# Patient Record
Sex: Female | Born: 1995 | Race: White | Hispanic: No | Marital: Single | Smoking: Never smoker
Health system: Southern US, Community
[De-identification: ages and names within clinical notes are randomized; demographics above are authoritative.]

## PROBLEM LIST (undated history)

## (undated) DIAGNOSIS — N924 Excessive bleeding in the premenopausal period: Secondary | ICD-10-CM

## (undated) HISTORY — PX: HAND SURGERY: SHX662

## (undated) HISTORY — DX: Excessive bleeding in the premenopausal period: N92.4

---
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
## 2014-03-10 ENCOUNTER — Ambulatory Visit (INDEPENDENT_AMBULATORY_CARE_PROVIDER_SITE_OTHER): Payer: BC Managed Care – PPO | Admitting: Family Medicine

## 2014-03-10 ENCOUNTER — Encounter: Payer: Self-pay | Admitting: Family Medicine

## 2014-03-10 VITALS — BP 120/72 | HR 98 | Temp 98.7°F | Ht 64.0 in | Wt 127.0 lb

## 2014-03-10 DIAGNOSIS — J189 Pneumonia, unspecified organism: Secondary | ICD-10-CM | POA: Insufficient documentation

## 2014-03-10 DIAGNOSIS — J181 Lobar pneumonia, unspecified organism: Principal | ICD-10-CM

## 2014-03-10 MED ORDER — HYDROCODONE-HOMATROPINE 5-1.5 MG/5ML PO SYRP: 5.0000 mL | ORAL_SOLUTION | Freq: Three times a day (TID) | ORAL | Status: DC | PRN

## 2014-03-10 MED ORDER — CLARITHROMYCIN 250 MG PO TABS: 250.0000 mg | ORAL_TABLET | Freq: Two times a day (BID) | ORAL | Status: DC

## 2014-03-10 NOTE — Progress Notes (Signed)
Pre visit review using our clinic review tool, if applicable. No additional management support is needed unless otherwise documented below in the visit note. 

## 2014-03-10 NOTE — Progress Notes (Signed)
   Subjective:    Patient ID: Vickie Thomas, female    DOB: 10/05/1996, 18 y.o.   MRN: [id number]  HPI Vickie Thomas is a 18 year old single female nonsmoker,,,,,,,, [place],,,,,, who comes in today with a six-day history of fever chills headache sore throat and cough  The fever and chills last about 3 or 4 days and went away. She otherwise feels well now said she's got a slight headache and a cough. No sputum production no shortness of breath etc. etc.  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   Review of Systems Review of systems otherwise negative    Objective:   Physical Exam  Well-developed well-nourished female no acute distress vital signs stable she is afebrile HEENT negative neck was supple no adenopathy lung exam shows crackles right base      Assessment & Plan:  Right lower lobe pneumonia...Marland Kitchen.Marland Kitchen. probable mycoplasmal..... treat with erythromycin cough syrup Tylenol.

## 2014-03-10 NOTE — Patient Instructions (Signed)
Biaxin 250 mg....... one twice daily for 10 days  Hydromet.......Marland Kitchen. 1/2-1 teaspoon at bedtime when necessary for cough and cold  Return when necessary

## 2014-03-23 ENCOUNTER — Other Ambulatory Visit: Payer: Self-pay

## 2014-03-23 ENCOUNTER — Encounter: Payer: Self-pay | Admitting: Family Medicine

## 2014-03-23 ENCOUNTER — Ambulatory Visit (INDEPENDENT_AMBULATORY_CARE_PROVIDER_SITE_OTHER)
Admission: RE | Admit: 2014-03-23 | Discharge: 2014-03-23 | Disposition: A | Discharge status: Home / Self Care | Payer: BC Managed Care – PPO | Source: Ambulatory Visit | Attending: Family Medicine | Admitting: Family Medicine

## 2014-03-23 ENCOUNTER — Ambulatory Visit (INDEPENDENT_AMBULATORY_CARE_PROVIDER_SITE_OTHER): Payer: BC Managed Care – PPO | Admitting: Family Medicine

## 2014-03-23 VITALS — BP 120/68 | HR 98 | Temp 98.6°F | Wt 127.0 lb

## 2014-03-23 DIAGNOSIS — R059 Cough, unspecified: Secondary | ICD-10-CM

## 2014-03-23 DIAGNOSIS — R05 Cough: Secondary | ICD-10-CM

## 2014-03-23 MED ORDER — BENZONATATE 200 MG PO CAPS: 200.0000 mg | ORAL_CAPSULE | Freq: Three times a day (TID) | ORAL | Status: DC | PRN

## 2014-03-23 NOTE — Progress Notes (Signed)
Pre visit review using our clinic review tool, if applicable. No additional management support is needed unless otherwise documented below in the visit note. 

## 2014-03-23 NOTE — Progress Notes (Signed)
Subjective:    Patient ID: Vickie Thomas, female    DOB: 04/06/1996, 18 y.o.   MRN: [id number]  HPI Comments: Patient is an 18 year old female who had an appointment tomorrow but left school and scheduled an appointment today because of an intense coughing spell at school around 9am this morning. This episode lasted several minutes, was non-productive and no hemoptysis. Cough was described as so intense that it caused gagging reflex but no vomiting. She was recently treated for pneumonia and completed her course of clarithromycin and hycodan. Symptoms were somewhat relieved [place] [employer] [place] break a week ago. Now she has returned to school, is sedentary and symptoms are worsening. There is a cold going around the school but she has no specific sick contacts. Mom listened to her lungs with stethoscope at home and thought she heard something in the right lung. Associated symptoms include one episode of dull throbbing right sided headache right lasting 2-3 hours and relieved by advil, some nasal congestion without dental pain relieved with [place] saline spray, post nasal drip and rhinorrhea. Patient endorses fatigue, chest tightness and some shortness of breath. No PMH of asthma or smoking. Patient denies fever, dizziness, enlarged nodes, body aches, joint pain, nausea, vomiting, unilateral leg swelling, leg pain or calf cramping. Patient did not have the flu shot this year.  Cough The current episode started 1 to 4 weeks ago. The problem has been gradually worsening. The problem occurs every few hours. The cough is non-productive. Associated symptoms include ear congestion, headaches, nasal congestion, postnasal drip, rhinorrhea and shortness of breath. Pertinent negatives include no chills, fever, hemoptysis, myalgias or weight loss.      Review of Systems  Constitutional: Negative for fever, chills and weight loss.  HENT: Positive for postnasal drip and rhinorrhea. Negative for  tinnitus and trouble swallowing.   Respiratory: Positive for cough and shortness of breath. Negative for hemoptysis.   Gastrointestinal: Negative for nausea, vomiting, abdominal pain, diarrhea and constipation.  Musculoskeletal: Negative for back pain, gait problem and myalgias.  Neurological: Positive for headaches. Negative for syncope and light-headedness.       Objective:   Physical Exam  Constitutional: Vital signs are normal. She appears well-developed and well-nourished.  HENT:  Head: Normocephalic.  Right Ear: External ear normal. No tenderness.  Left Ear: External ear normal. No tenderness.  Nose: Nose normal. No rhinorrhea. Right sinus exhibits no maxillary sinus tenderness and no frontal sinus tenderness. Left sinus exhibits no maxillary sinus tenderness and no frontal sinus tenderness.  Mouth/Throat: Oropharynx is clear and moist and mucous membranes are normal. Mucous membranes are not pale and not dry. No oropharyngeal exudate.  Eyes: Conjunctivae and EOM are normal. Pupils are equal, round, and reactive to light.  Neck: Normal range of motion.  Cardiovascular: Normal heart sounds.   Pulmonary/Chest: Effort normal and breath sounds normal. No respiratory distress. She has no wheezes. She has no rales. She exhibits no tenderness.  Lymphadenopathy:       Head (right side): No submental, no submandibular, no tonsillar, no preauricular, no posterior auricular and no occipital adenopathy present.       Head (left side): No submental, no submandibular, no tonsillar, no preauricular, no posterior auricular and no occipital adenopathy present.    She has no cervical adenopathy.  Neurological: She is alert. She has normal strength. Coordination and gait normal.  Skin: Skin is warm. She is diaphoretic.  Assessment & Plan:  1. Cough Since patient is not improving after taking clarithromycin, plan to image with chest xray to determine if infection remains. Will not do  blood count. Will consider prescribing stronger antibiotic based on xray results. Patient given educational handout and advised to seek medical treatment if she develops fever or if symptoms worsen.  GrenadaBrittany Zacharey Jensen, PA-S   As above,  Presumptive dx of pneumonia 2 weeks ago.  No documented recurrent fever but has persistent cough with nonfocal exam.  CXR to further evaluate.  She is in no respiratory distress and afebrile today.  Evelena PeatBruce Burchette, MD

## 2014-03-23 NOTE — Patient Instructions (Signed)

## 2014-03-24 ENCOUNTER — Ambulatory Visit: Payer: BC Managed Care – PPO | Admitting: Family Medicine

## 2014-07-16 ENCOUNTER — Ambulatory Visit: Payer: Self-pay | Admitting: Nurse Practitioner

## 2014-07-16 ENCOUNTER — Telehealth: Payer: Self-pay | Admitting: Family Medicine

## 2014-07-16 ENCOUNTER — Encounter: Payer: Self-pay | Admitting: Family Medicine

## 2014-07-16 ENCOUNTER — Ambulatory Visit (INDEPENDENT_AMBULATORY_CARE_PROVIDER_SITE_OTHER): Payer: BC Managed Care – PPO | Admitting: Family Medicine

## 2014-07-16 VITALS — BP 100/60 | HR 140 | Temp 103.0°F | Ht 64.0 in | Wt 128.0 lb

## 2014-07-16 DIAGNOSIS — J020 Streptococcal pharyngitis: Secondary | ICD-10-CM

## 2014-07-16 DIAGNOSIS — J029 Acute pharyngitis, unspecified: Secondary | ICD-10-CM

## 2014-07-16 LAB — POCT RAPID STREP A (OFFICE): Rapid Strep A Screen: POSITIVE — AB

## 2014-07-16 MED ORDER — PENICILLIN G BENZATHINE 1200000 UNIT/2ML IM SUSP
1.2000 10*6.[IU] | Freq: Once | INTRAMUSCULAR | Status: AC
  Administered 2014-07-16: 1.2 10*6.[IU] via INTRAMUSCULAR

## 2014-07-16 NOTE — Telephone Encounter (Signed)
Patient Information:  Caller Name: Vickie Thomas  Phone: [phone]  Patient: Vickie Thomas, Vickie Thomas  Gender: Female  DOB: 1996/11/07  Age: 18 Years  PCP: Evelena PeatBurchette, [facility](Family Practice)  Pregnant: No  Office Follow Up:  Does the office need to follow up with this patient?: No  Instructions For The Office: N/A  RN Note:  Sore Throat, Lymph Node swelling, Fever, onset 8-12.  Pt is drinking and urinating normally. All emergent sxs ruled out per Sore Throat protocol, see today d/t severe sore throat pain.  [facility] at [facility] d/t [facility] Set[facility]assfied was full, office called Dad during triage w/ Mom and scheduled appt at 1430 on 8-13 w/ Dr Durene CalHunter.  Symptoms  Reason For Call & Symptoms: Sore Throat, Lymph Node swelling, Fever, onset 8-12.  Reviewed Health History In EMR: Yes  Reviewed Medications In EMR: Yes  Reviewed Allergies In EMR: Yes  Reviewed Surgeries / Procedures: Yes  Date of Onset of Symptoms: 07/15/2014  Any Fever: Yes  Fever Taken: Ear Thermometer  Fever Time Of Reading: 07:30:00  Fever Last Reading: 101.9 OB / GYN:  LMP: Unknown  Guideline(s) Used:  Sore Throat  Disposition Per Guideline:   See Today in Office  Reason For Disposition Reached:   Severe sore throat pain  Advice Given:  N/A  Patient Will Follow Care Advice:  YES

## 2014-07-16 NOTE — Patient Instructions (Signed)
If still having fevers Monday, please return or if symptoms worsen we have Saturday hours.  I suspect you will feel better in 24-48 hours. Not infectious after 24 hours without a fever above 100.5.  Tylenol/ibuprofen for pain-would schedule over next day.   Strep Throat Strep throat is an infection of the throat caused by a bacteria named Streptococcus pyogenes. Your health care provider may call the infection streptococcal "tonsillitis" or "pharyngitis" depending on whether there are signs of inflammation in the tonsils or back of the throat. Strep throat is most common in children aged 5-15 years during the cold months of the year, but it can occur in people of any age during any season. This infection is spread from person to person (contagious) through coughing, sneezing, or other close contact. SIGNS AND SYMPTOMS   Fever or chills.  Painful, swollen, red tonsils or throat.  Pain or difficulty when swallowing.  White or yellow spots on the tonsils or throat.  Swollen, tender lymph nodes or "glands" of the neck or under the jaw.  Red rash all over the body (rare). DIAGNOSIS  Many different infections can cause the same symptoms. A test must be done to confirm the diagnosis so the right treatment can be given. A "rapid strep test" can help your health care provider make the diagnosis in a few minutes. If this test is not available, a light swab of the infected area can be used for a throat culture test. If a throat culture test is done, results are usually available in a day or two. TREATMENT  Strep throat is treated with antibiotic medicine. HOME CARE INSTRUCTIONS   Gargle with 1 tsp of salt in 1 cup of warm water, 3-4 times per day or as needed for comfort.  Family members who also have a sore throat or fever should be tested for strep throat and treated with antibiotics if they have the strep infection.  Make sure everyone in your household washes their hands well.  Do not share  food, drinking cups, or personal items that could cause the infection to spread to others.  You may need to eat a soft food diet until your sore throat gets better.  Drink enough water and fluids to keep your urine clear or pale yellow. This will help prevent dehydration.  Get plenty of rest.  Stay home from school, day care, or work until you have been on antibiotics for 24 hours.  Take medicines only as directed by your health care provider.  Take your antibiotic medicine as directed by your health care provider. Finish it even if you start to feel better. SEEK MEDICAL CARE IF:   The glands in your neck continue to enlarge.  You develop a rash, cough, or earache.  You cough up green, yellow-brown, or bloody sputum.  You have pain or discomfort not controlled by medicines.  Your problems seem to be getting worse rather than better.  You have a fever. SEEK IMMEDIATE MEDICAL CARE IF:   You develop any new symptoms such as vomiting, severe headache, stiff or painful neck, chest pain, shortness of breath, or trouble swallowing.  You develop severe throat pain, drooling, or changes in your voice.  You develop swelling of the neck, or the skin on the neck becomes red and tender.  You develop signs of dehydration, such as fatigue, dry mouth, and decreased urination.  You become increasingly sleepy, or you cannot wake up completely. MAKE SURE YOU:  Understand these instructions.  Will  watch your condition.  Will get help right away if you are not doing well or get worse. Document Released: 11/17/2000 Document Revised: 04/06/2014 Document Reviewed: 01/19/2011 [facility] Patient Information 2015 [place], [place]. This information is not intended to replace advice given to you by your health care provider. Make sure you discuss any questions you have with your health care provider.

## 2014-07-16 NOTE — Telephone Encounter (Signed)
Pt here for appt.

## 2014-07-16 NOTE — Addendum Note (Signed)
Addended by: Lieutenant DiegoHINES, Remie Mathison A on: 07/16/2014 05:14 PM   Modules accepted: Orders

## 2014-07-16 NOTE — Progress Notes (Signed)
  Vickie ConchStephen Hunter, MD Phone: [phone]  Subjective:   Vickie Thomas is a 18 y.o. year old very pleasant female patient who presents with the following:  Fever/sore throat Symptoms started yesterday at 6pm including fever to 101 at home.Sore throat started last night. No known sick contacts. Around roommate and family in last week as well as a party last weekend. Endorses chills.  ROS-No cough. No congestion. No nausea/vomiting.   Past Medical History-history of walking pneumonia in April, otherwise healthy Social history-was about to [place] [facility] today [place] [employer] Medications- birth control by ob/gyn  Objective: BP 100/60  Pulse 140  Temp(Src) 103 F (39.4 C)  Ht 5\' 4"  (1.626 m)  Wt 128 lb (58.06 kg)  BMI 21.96 kg/m2  SpO2 97% Gen: NAD, resting comfortably Neck: tender lymphadenopathy in anterior chain HEENT: TM normal, mildly dry mucus membranes, pharynx cherry red with edematous tonsils 2+ CV: tachycardia no murmurs rubs or gallops Lungs: CTAB no crackles, wheeze, rhonchi Abdomen: soft/nontender/nondistended/normal bowel sounds.  Ext: no edema Skin: warm, dry, no rash   Results for orders placed in visit on 07/16/14 (from the past 24 hour(s))  POCT RAPID STREP A (OFFICE)     Status: Abnormal   Collection Time    07/16/14  2:56 PM      Result Value Ref Range   Rapid Strep A Screen Positive (*) Negative   Assessment/Plan:  Streptococcal Strep Throat Bicillin IM. Gave precautions for return.   No orders of the defined types were placed in this encounter.

## 2014-07-16 NOTE — Telephone Encounter (Signed)
Keba-FYI.

## 2014-07-17 ENCOUNTER — Telehealth: Payer: Self-pay | Admitting: Family Medicine

## 2014-07-17 NOTE — Telephone Encounter (Signed)
FYI

## 2014-07-17 NOTE — Telephone Encounter (Signed)
Patient Information:  Caller Name: Fransisco HertzSiobhan  Phone: [phone]  Patient: Vickie Thomas, [name]  Gender: Female  DOB: September 22, 1996  Age: [age] Years  PCP: Evelena PeatBurchette, [facility](Family Practice)  Pregnant: No  Office Follow Up:  Does the office need to follow up with this patient?: No  Instructions For The Office: N/A  RN Note:  Advised mom it takes 48 hours for antibiotic to get into the system.  Advised increase fluids, cold fluids work with strep throat, tylenol and ibuprofen as prescribed. Call back if pt is not improved after 48 hrs from injection.  Symptoms  Reason For Call & Symptoms: Mom reports pt was seen in the office 07/16/14 and diagnosed with Strep throat. PCN injection given.  Mom states pt still have fever and sore throat pain.  Reviewed Health History In EMR: Yes  Reviewed Medications In EMR: Yes  Reviewed Allergies In EMR: Yes  Reviewed Surgeries / Procedures: Yes  Date of Onset of Symptoms: 07/16/2014  Treatments Tried: Tylenol and Ibuprofen alternating.  Treatments Tried Worked: Yes  Any Fever: Yes  Fever Taken: Ear Thermometer  Fever Time Of Reading: 08:30:00  Fever Last Reading: 102.2 OB / GYN:  LMP: Unknown  Guideline(s) Used:  Strep Throat Test Follow-Up Call  Sore Throat  No Protocol Available - Sick Adult  Disposition Per Guideline:   Home Care  Reason For Disposition Reached:   Patient's symptoms are safe to treat at home per nursing judgment  Advice Given:  Call Back If:  New symptoms develop  You become worse.  Patient Will Follow Care Advice:  YES

## 2014-07-18 ENCOUNTER — Ambulatory Visit (INDEPENDENT_AMBULATORY_CARE_PROVIDER_SITE_OTHER): Payer: BC Managed Care – PPO | Admitting: Family Medicine

## 2014-07-18 ENCOUNTER — Encounter: Payer: Self-pay | Admitting: Family Medicine

## 2014-07-18 VITALS — BP 108/72 | Temp 98.9°F | Wt 123.0 lb

## 2014-07-18 DIAGNOSIS — J020 Streptococcal pharyngitis: Secondary | ICD-10-CM

## 2014-07-18 LAB — POCT RAPID STREP A (OFFICE): Rapid Strep A Screen: NEGATIVE

## 2014-07-18 NOTE — Progress Notes (Signed)
   Subjective:    Patient ID: Vickie Thomas, female    DOB: [birth year], 18 y.o.   MRN: [id number]  Sore Throat  Pertinent negatives include no coughing.  Fever  Associated symptoms include a sore throat. Pertinent negatives include no coughing.   Patient seen for followup regarding strep pharyngitis. She was seen on Thursday (2 days ago) and received Bicillin penicillin. Her fever has trended downward and she is afebrile at this time. She still has swollen nodes and sore throat but overall feels slightly better. No nausea or vomiting. No headaches. No skin rash. 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  Past Medical History  Diagnosis Date  . Premenopausal menorrhagia 10/08/2013   Past Surgical History  Procedure Laterality Date  . Hand surgery      reports that she has never smoked. She does not have any smokeless tobacco history on file. She reports that she does not drink alcohol or use illicit drugs. family history includes Asthma in her paternal grandmother; Cancer in her maternal grandfather; Depression in her paternal grandmother; Heart disease in her paternal grandfather and paternal grandmother; Hyperlipidemia in her father; Hypertension in her paternal grandfather and paternal grandmother. No Known Allergies      Review of Systems  Constitutional: Positive for fever. Negative for chills.  HENT: Positive for sore throat.   Respiratory: Negative for cough.        Objective:   Physical Exam  Constitutional: She appears well-developed and well-nourished. No distress.  HENT:  Right Ear: External ear normal.  Left Ear: External ear normal.  Mild erythema posterior pharynx. No exudate  Neck:  She has tender anterior cervical nodes bilaterally. Neck is supple  Cardiovascular: Normal rate.   Pulmonary/Chest: Effort normal and breath sounds normal. No respiratory distress. She has no wheezes. She has no rales.  Skin:  No rash noted.          Assessment & Plan:  Strep pharyngitis. Patient received Bicillin penicillin injection couple days ago and is gradually improving. Recommend symptomatic treatment at this point and followup promptly for recurrent fever or worsening symptoms.

## 2014-08-30 ENCOUNTER — Encounter (HOSPITAL_COMMUNITY): Payer: Self-pay | Admitting: Emergency Medicine

## 2014-08-30 ENCOUNTER — Emergency Department (HOSPITAL_COMMUNITY)
Admission: EM | Admit: 2014-08-30 | Discharge: 2014-08-30 | Disposition: A | Discharge status: Home / Self Care | Payer: BC Managed Care – PPO | Attending: Emergency Medicine | Admitting: Emergency Medicine

## 2014-08-30 DIAGNOSIS — S298XXA Other specified injuries of thorax, initial encounter: Secondary | ICD-10-CM | POA: Insufficient documentation

## 2014-08-30 DIAGNOSIS — Y9389 Activity, other specified: Secondary | ICD-10-CM | POA: Diagnosis not present

## 2014-08-30 DIAGNOSIS — R110 Nausea: Secondary | ICD-10-CM | POA: Diagnosis not present

## 2014-08-30 DIAGNOSIS — S0990XA Unspecified injury of head, initial encounter: Secondary | ICD-10-CM | POA: Diagnosis not present

## 2014-08-30 DIAGNOSIS — Z8742 Personal history of other diseases of the female genital tract: Secondary | ICD-10-CM | POA: Diagnosis not present

## 2014-08-30 DIAGNOSIS — Y92410 Unspecified street and highway as the place of occurrence of the external cause: Secondary | ICD-10-CM | POA: Diagnosis not present

## 2014-08-30 MED ORDER — ACETAMINOPHEN 325 MG PO TABS
650.0000 mg | ORAL_TABLET | Freq: Once | ORAL | Status: AC
  Administered 2014-08-30: 650 mg via ORAL
  Filled 2014-08-30: qty 2
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
## 2015-08-22 IMAGING — CR DG CHEST 2V
2 series · 2 of 2 positions shown · non-contrast
Comparison: None.

CLINICAL DATA: Cough.  Shortness of breath.

EXAM:
CHEST  2 VIEW

[view not recorded (1 of 2)]
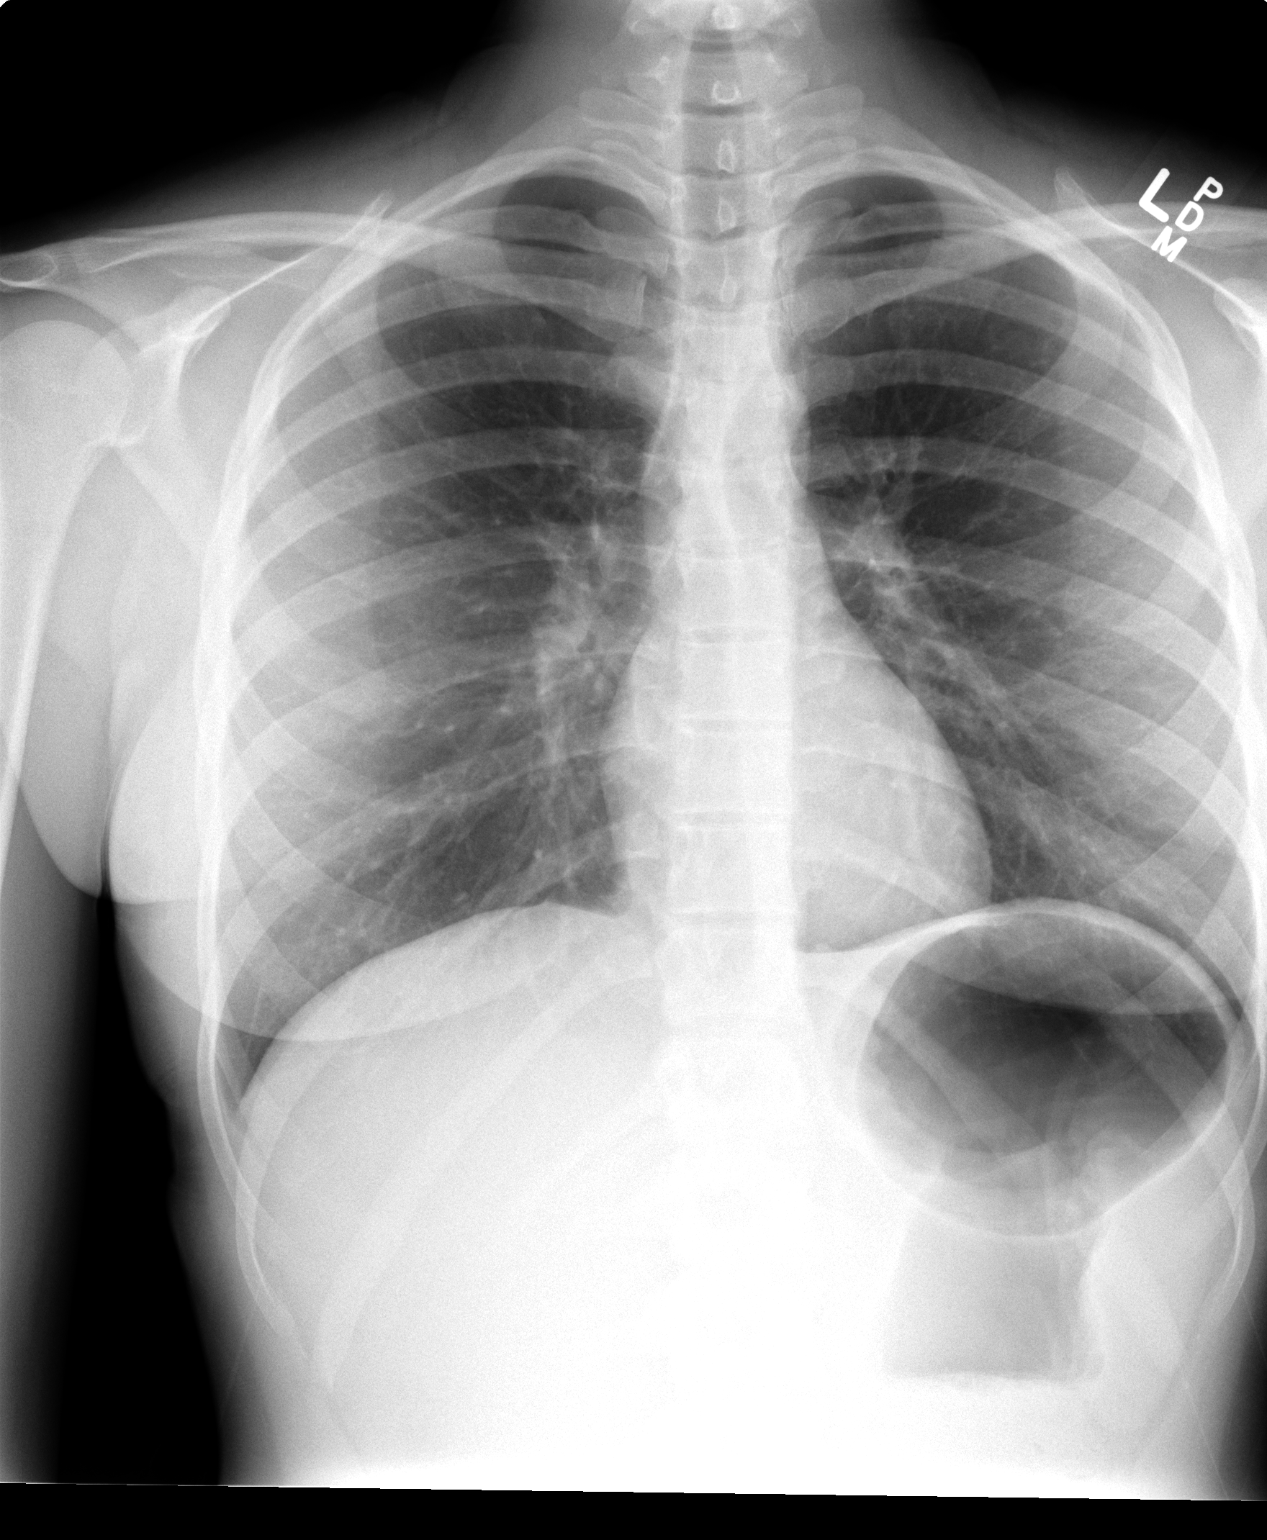

[view not recorded (2 of 2)]
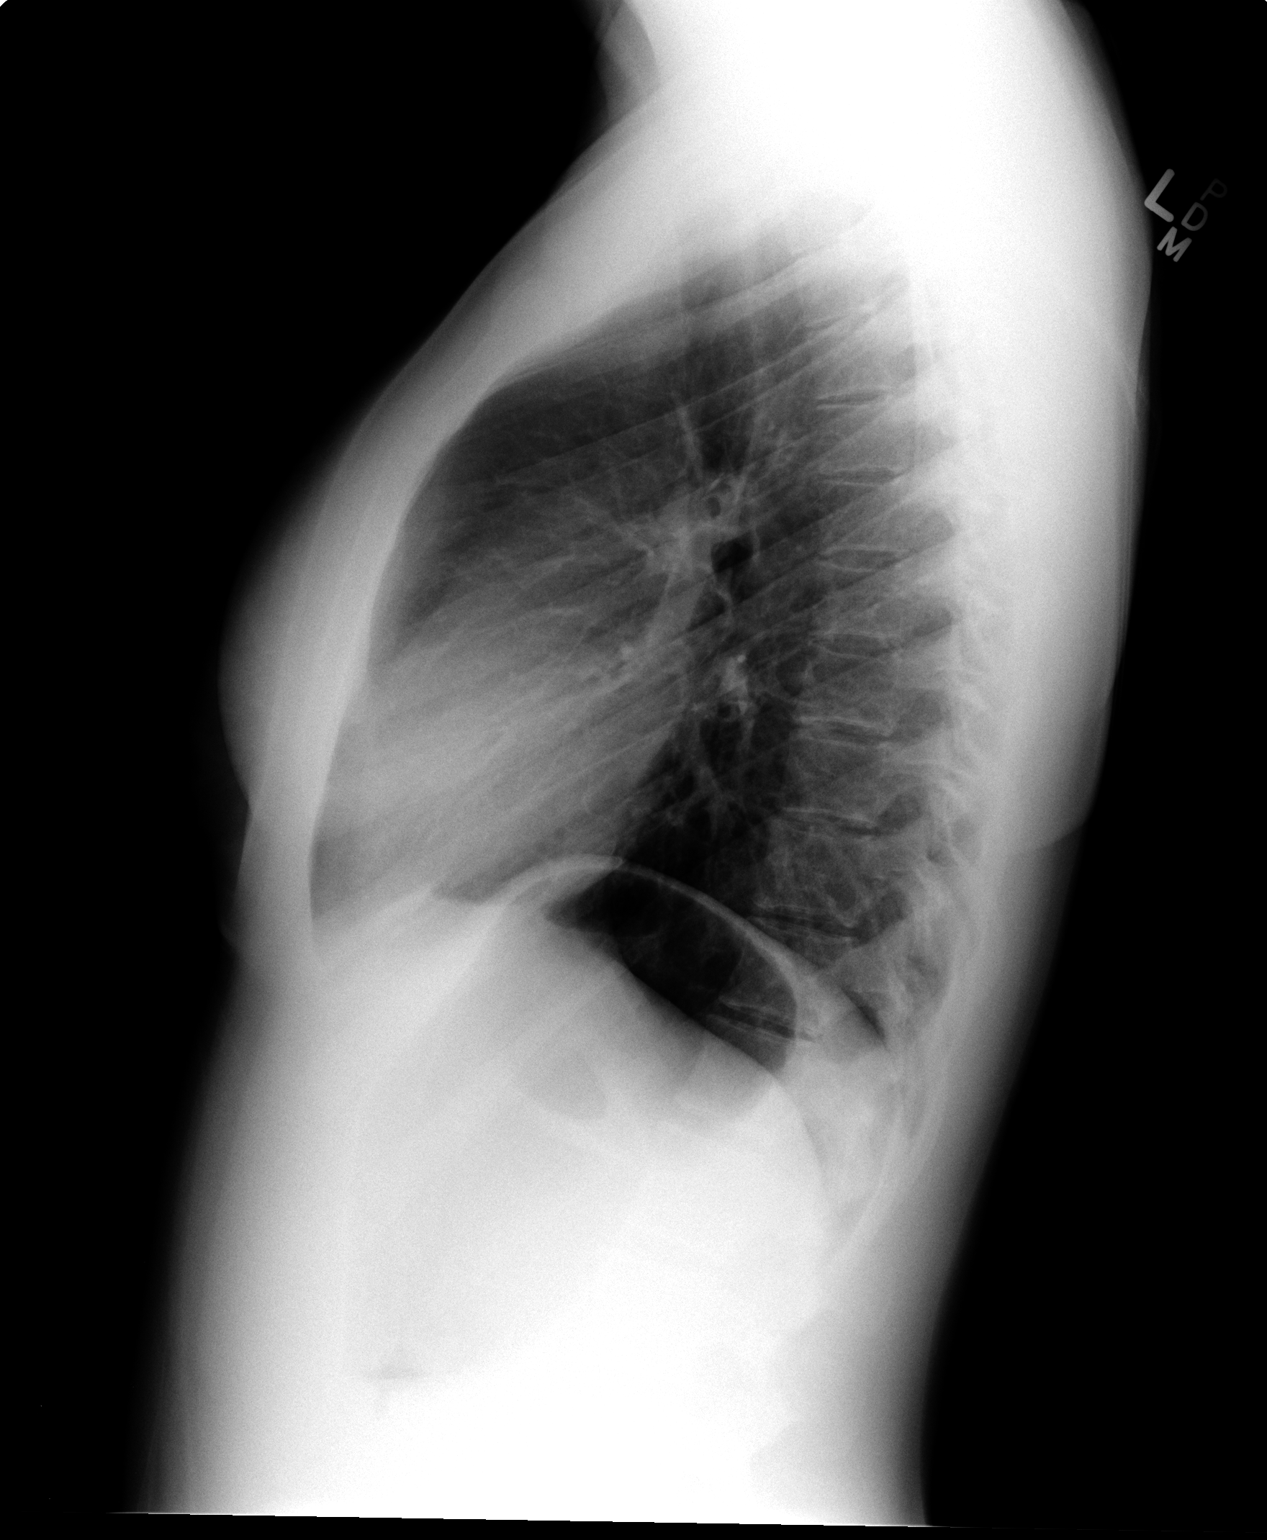

[2 of 2 positions shown; findings below may reference images not displayed]

FINDINGS: The heart size and mediastinal contours are within normal limits.
Both lungs are clear. The visualized skeletal structures are
unremarkable.
IMPRESSION: No active cardiopulmonary disease.

## 2017-03-30 ENCOUNTER — Encounter: Payer: Self-pay | Admitting: Family Medicine

## 2017-03-30 ENCOUNTER — Ambulatory Visit (INDEPENDENT_AMBULATORY_CARE_PROVIDER_SITE_OTHER): Payer: BLUE CROSS/BLUE SHIELD | Admitting: Family Medicine

## 2017-03-30 VITALS — BP 108/84 | HR 120 | Temp 99.1°F | Wt 126.3 lb

## 2017-03-30 DIAGNOSIS — R233 Spontaneous ecchymoses: Secondary | ICD-10-CM

## 2017-03-30 DIAGNOSIS — J029 Acute pharyngitis, unspecified: Secondary | ICD-10-CM | POA: Diagnosis not present

## 2017-03-30 LAB — CBC WITH DIFFERENTIAL/PLATELET
BASOS ABS: 0 10*3/uL (ref 0.0–0.1)
BASOS PCT: 0.2 % (ref 0.0–3.0)
Eosinophils Absolute: 0 10*3/uL (ref 0.0–0.7)
Eosinophils Relative: 0.2 % (ref 0.0–5.0)
HEMATOCRIT: 39.9 % (ref 36.0–46.0)
HEMOGLOBIN: 13.5 g/dL (ref 12.0–15.0)
LYMPHS PCT: 12.8 % (ref 12.0–46.0)
Lymphs Abs: 1.1 10*3/uL (ref 0.7–4.0)
MCHC: 33.8 g/dL (ref 30.0–36.0)
MCV: 85.7 fl (ref 78.0–100.0)
MONOS PCT: 13.1 % — AB (ref 3.0–12.0)
Monocytes Absolute: 1.1 10*3/uL — ABNORMAL HIGH (ref 0.1–1.0)
NEUTROS ABS: 6.2 10*3/uL (ref 1.4–7.7)
Neutrophils Relative %: 73.7 % (ref 43.0–77.0)
PLATELETS: 172 10*3/uL (ref 150.0–400.0)
RBC: 4.66 Mil/uL (ref 3.87–5.11)
RDW: 14.4 % (ref 11.5–15.5)
WBC: 8.4 10*3/uL (ref 4.0–10.5)

## 2017-03-30 LAB — POCT RAPID STREP A (OFFICE): Rapid Strep A Screen: NEGATIVE

## 2017-03-30 LAB — POCT MONO (EPSTEIN BARR VIRUS): Mono, POC: NEGATIVE

## 2017-03-30 NOTE — Progress Notes (Signed)
Subjective:     Patient ID: Vickie Thomas, female   DOB: [birth year], 21 y.o.   MRN: [id number]  HPI Patient seen with sore throat, body aches, night sweats for the past few days. She states this is a third episode in about 2 months. Initial episode occurred around early March. She went to student health at [facility] There was no testing done. They speculated she has some type of viral illness. She got better after a few days. Around and of March she had similar occurrence and [facility] She states that rapid strep testing and strep cultures were both negative.  Current symptoms started couple days ago with sore throat, body aches, night sweats. Denies any nasal congestion. No cough. No allergic symptoms. No rash. She's had some tenderness anterior cervical nodes. She's not noted any posterior cervical nodes. No history of mono. Has had some general fatigue. She's also noted some non-provoked bruises on her lower extremities. No other bleeding issues. Low-grade fever around 100 last night. No aspirin use  Past Medical History:  Diagnosis Date  . Premenopausal menorrhagia 10/08/2013   Past Surgical History:  Procedure Laterality Date  . HAND SURGERY      reports that she has never smoked. She has never used smokeless tobacco. She reports that she does not drink alcohol or use drugs. family history includes Asthma in her paternal grandmother; Cancer in her maternal grandfather; Depression in her paternal grandmother; Heart disease in her paternal grandfather and paternal grandmother; Hyperlipidemia in her father; Hypertension in her paternal grandfather and paternal grandmother. No Known Allergies   Review of Systems  Constitutional: Positive for fatigue and fever.  HENT: Positive for sore throat.   Respiratory: Negative for cough and shortness of breath.   Cardiovascular: Negative for chest pain.  Gastrointestinal: Negative for abdominal pain, nausea and vomiting.   Genitourinary: Negative for dysuria.  Skin: Negative for rash.  Neurological: Negative for headaches.  Hematological: Bruises/bleeds easily.       Objective:   Physical Exam  Constitutional: She appears well-developed and well-nourished.  HENT:  Right Ear: External ear normal.  Left Ear: External ear normal.  Mouth/Throat: Oropharyngeal exudate present.  She has some erythema posterior pharynx. She has some whitish exudate right tonsil but not the left. She does not have any soft palate asymmetry or other suggestion of peritonsillar abscess.  Neck: Neck supple.  She has some tender anterior cervical nodes which are fairly small. No posterior cervical adenopathy.  Cardiovascular: Normal rate and regular rhythm.   Pulmonary/Chest: Effort normal and breath sounds normal. No respiratory distress. She has no wheezes. She has no rales.  Abdominal: Soft. She exhibits no mass. There is no tenderness. There is no rebound and no guarding.  No splenomegaly or hepatomegaly  Musculoskeletal: She exhibits no edema.  Lymphadenopathy:    She has cervical adenopathy.  Skin: No rash noted.  Patient has a few scattered bruises on her lower extremities. No hematoma. None noted upper extremities or trunk       Assessment:     Patient presents with recurrent exudative pharyngitis. Recent strep testing a month ago normal. She's had intervals of symptoms clearing between episodes.Differential is group A strep, viral, mono  Nontraumatic ecchymoses lower extremities of uncertain significance    Plan:     -Repeat rapid strep testing-And if negative obtain throat culture -Consider CBC with differential -Saltwater gargles several times daily -Tylenol as needed for discomfort -Check Monospot  Elberta Fortis Kazi Montoro  MD [facility]

## 2017-03-30 NOTE — Patient Instructions (Signed)

## 2017-03-30 NOTE — Progress Notes (Signed)
Pre visit review using our clinic review tool, if applicable. No additional management support is needed unless otherwise documented below in the visit note. 

## 2017-04-01 LAB — CULTURE, GROUP A STREP

## 2017-07-10 ENCOUNTER — Encounter (HOSPITAL_BASED_OUTPATIENT_CLINIC_OR_DEPARTMENT_OTHER): Payer: Self-pay | Admitting: Emergency Medicine

## 2017-07-10 ENCOUNTER — Emergency Department (HOSPITAL_BASED_OUTPATIENT_CLINIC_OR_DEPARTMENT_OTHER)
Admission: EM | Admit: 2017-07-10 | Discharge: 2017-07-10 | Disposition: A | Discharge status: Home / Self Care | Payer: BLUE CROSS/BLUE SHIELD

## 2017-07-10 ENCOUNTER — Emergency Department (HOSPITAL_BASED_OUTPATIENT_CLINIC_OR_DEPARTMENT_OTHER): Payer: BLUE CROSS/BLUE SHIELD

## 2017-07-10 LAB — URINALYSIS, ROUTINE W REFLEX MICROSCOPIC

## 2017-07-10 LAB — URINALYSIS, MICROSCOPIC (REFLEX)

## 2017-07-10 LAB — PREGNANCY, URINE

## 2017-07-10 LAB — CBC

## 2017-07-10 MED ORDER — SODIUM CHLORIDE 0.9 % IV BOLUS (SEPSIS)
1000.0000 mL | Freq: Once | INTRAVENOUS | Status: DC
  Administered 2017-07-10: 1000 mL via INTRAVENOUS

## 2017-07-10 MED ORDER — ONDANSETRON HCL 4 MG/2ML IJ SOLN
4.0000 mg | Freq: Once | INTRAMUSCULAR | Status: AC | PRN
  Administered 2017-07-10: 4 mg via INTRAVENOUS
  Filled 2017-07-10: qty 2

## 2017-07-10 MED ORDER — KETOROLAC TROMETHAMINE 30 MG/ML IJ SOLN
30.0000 mg | Freq: Once | INTRAMUSCULAR | Status: AC
  Administered 2017-07-10: 30 mg via INTRAVENOUS
  Filled 2017-07-10: qty 1

## 2017-07-10 NOTE — ED Notes (Signed)
Pt's mother came to desk to state that pt was nauseated

## 2017-07-10 NOTE — ED Triage Notes (Signed)
Pt awoke with left sided abd pain. Denies n/v/d, denies urinary symptoms.

## 2017-07-10 NOTE — ED Provider Notes (Addendum)
Visit on 07/10/17 was done in error.  Patient was not seen in the emergency department on this date.  The labs and urine resulted on this date between 2-3 am are NOT this patient's.   Ward, Layla MawKristen N, DO 07/10/17 0319    Ward, Layla MawKristen N, DO 07/10/17 [phone]

## 2017-07-10 NOTE — ED Notes (Signed)
Patient registered in error, her twin sister is the patient
